# Patient Record
Sex: Female | Born: 1980 | Race: Black or African American | Hispanic: No | Marital: Married | Smoking: Former smoker
Health system: Southern US, Community
[De-identification: ages and names within clinical notes are randomized; demographics above are authoritative.]

## PROBLEM LIST (undated history)

## (undated) DIAGNOSIS — J45909 Unspecified asthma, uncomplicated: Secondary | ICD-10-CM

## (undated) DIAGNOSIS — G4700 Insomnia, unspecified: Secondary | ICD-10-CM

## (undated) DIAGNOSIS — M5410 Radiculopathy, site unspecified: Secondary | ICD-10-CM

## (undated) DIAGNOSIS — F4310 Post-traumatic stress disorder, unspecified: Secondary | ICD-10-CM

## (undated) DIAGNOSIS — R569 Unspecified convulsions: Secondary | ICD-10-CM

## (undated) DIAGNOSIS — G935 Compression of brain: Secondary | ICD-10-CM

## (undated) DIAGNOSIS — G43909 Migraine, unspecified, not intractable, without status migrainosus: Secondary | ICD-10-CM

## (undated) HISTORY — DX: Unspecified asthma, uncomplicated: J45.909

## (undated) HISTORY — DX: Insomnia, unspecified: G47.00

## (undated) HISTORY — DX: Migraine, unspecified, not intractable, without status migrainosus: G43.909

## (undated) HISTORY — DX: Post-traumatic stress disorder, unspecified: F43.10

## (undated) HISTORY — DX: Radiculopathy, site unspecified: M54.10

## (undated) HISTORY — DX: Compression of brain: G93.5

---

## 2020-09-22 ENCOUNTER — Other Ambulatory Visit: Payer: Self-pay

## 2020-09-22 ENCOUNTER — Emergency Department (HOSPITAL_COMMUNITY)
Admission: EM | Admit: 2020-09-22 | Discharge: 2020-09-23 | Disposition: A | Discharge status: Home / Self Care | Payer: No Typology Code available for payment source | Attending: Emergency Medicine | Admitting: Emergency Medicine

## 2020-09-22 ENCOUNTER — Encounter (HOSPITAL_COMMUNITY): Payer: Self-pay | Admitting: Emergency Medicine

## 2020-09-22 DIAGNOSIS — S3992XA Unspecified injury of lower back, initial encounter: Secondary | ICD-10-CM | POA: Diagnosis present

## 2020-09-22 DIAGNOSIS — F17200 Nicotine dependence, unspecified, uncomplicated: Secondary | ICD-10-CM | POA: Diagnosis not present

## 2020-09-22 DIAGNOSIS — S39012A Strain of muscle, fascia and tendon of lower back, initial encounter: Secondary | ICD-10-CM | POA: Insufficient documentation

## 2020-09-22 DIAGNOSIS — R519 Headache, unspecified: Secondary | ICD-10-CM | POA: Diagnosis not present

## 2020-09-22 HISTORY — DX: Unspecified convulsions: R56.9

## 2020-09-22 MED ORDER — OXYCODONE-ACETAMINOPHEN 5-325 MG PO TABS
1.0000 | ORAL_TABLET | ORAL | Status: DC | PRN
  Administered 2020-09-22: 1 via ORAL
  Filled 2020-09-22: qty 1

## 2020-09-22 MED ORDER — IBUPROFEN 800 MG PO TABS
800.0000 mg | ORAL_TABLET | Freq: Once | ORAL | Status: AC
  Administered 2020-09-22: 800 mg via ORAL
  Filled 2020-09-22: qty 1
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
## 2020-09-22 NOTE — Discharge Instructions (Addendum)
Take 4 over the counter ibuprofen tablets 3 times a day or 2 over-the-counter naproxen tablets twice a day for pain. Also take tylenol 1000mg(2 extra strength) four times a day.    
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
## 2020-09-23 ENCOUNTER — Emergency Department (HOSPITAL_COMMUNITY): Payer: No Typology Code available for payment source

## 2020-09-23 LAB — PREGNANCY, URINE: Preg Test, Ur: NEGATIVE

## 2020-09-23 MED ORDER — METHOCARBAMOL 500 MG PO TABS: 500.0000 mg | ORAL_TABLET | Freq: Two times a day (BID) | ORAL | 0 refills | Status: DC
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
## 2020-12-25 DIAGNOSIS — G935 Compression of brain: Secondary | ICD-10-CM | POA: Insufficient documentation

## 2020-12-25 DIAGNOSIS — Z6831 Body mass index (BMI) 31.0-31.9, adult: Secondary | ICD-10-CM | POA: Insufficient documentation

## 2021-08-05 ENCOUNTER — Ambulatory Visit: Payer: No Typology Code available for payment source | Admitting: Neurology

## 2021-08-10 ENCOUNTER — Ambulatory Visit (INDEPENDENT_AMBULATORY_CARE_PROVIDER_SITE_OTHER): Payer: No Typology Code available for payment source | Admitting: Neurology

## 2021-08-10 ENCOUNTER — Encounter: Payer: Self-pay | Admitting: Neurology

## 2021-08-10 VITALS — BP 128/84 | HR 85 | Ht 60.0 in | Wt 162.0 lb

## 2021-08-10 DIAGNOSIS — G43709 Chronic migraine without aura, not intractable, without status migrainosus: Secondary | ICD-10-CM

## 2021-08-10 DIAGNOSIS — G40009 Localization-related (focal) (partial) idiopathic epilepsy and epileptic syndromes with seizures of localized onset, not intractable, without status epilepticus: Secondary | ICD-10-CM | POA: Diagnosis not present

## 2021-08-10 DIAGNOSIS — F4310 Post-traumatic stress disorder, unspecified: Secondary | ICD-10-CM | POA: Diagnosis not present

## 2021-08-10 MED ORDER — DIVALPROEX SODIUM 500 MG PO DR TAB: 500.0000 mg | DELAYED_RELEASE_TABLET | Freq: Every day | ORAL | 2 refills | Status: DC
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
## 2021-08-11 IMAGING — CR DG LUMBAR SPINE COMPLETE 4+V
4 series · 4 of 4 positions shown · non-contrast
Comparison: None.

CLINICAL DATA: Pain

EXAM:
LUMBAR SPINE - COMPLETE 4+ VIEW

[l-spine ap]
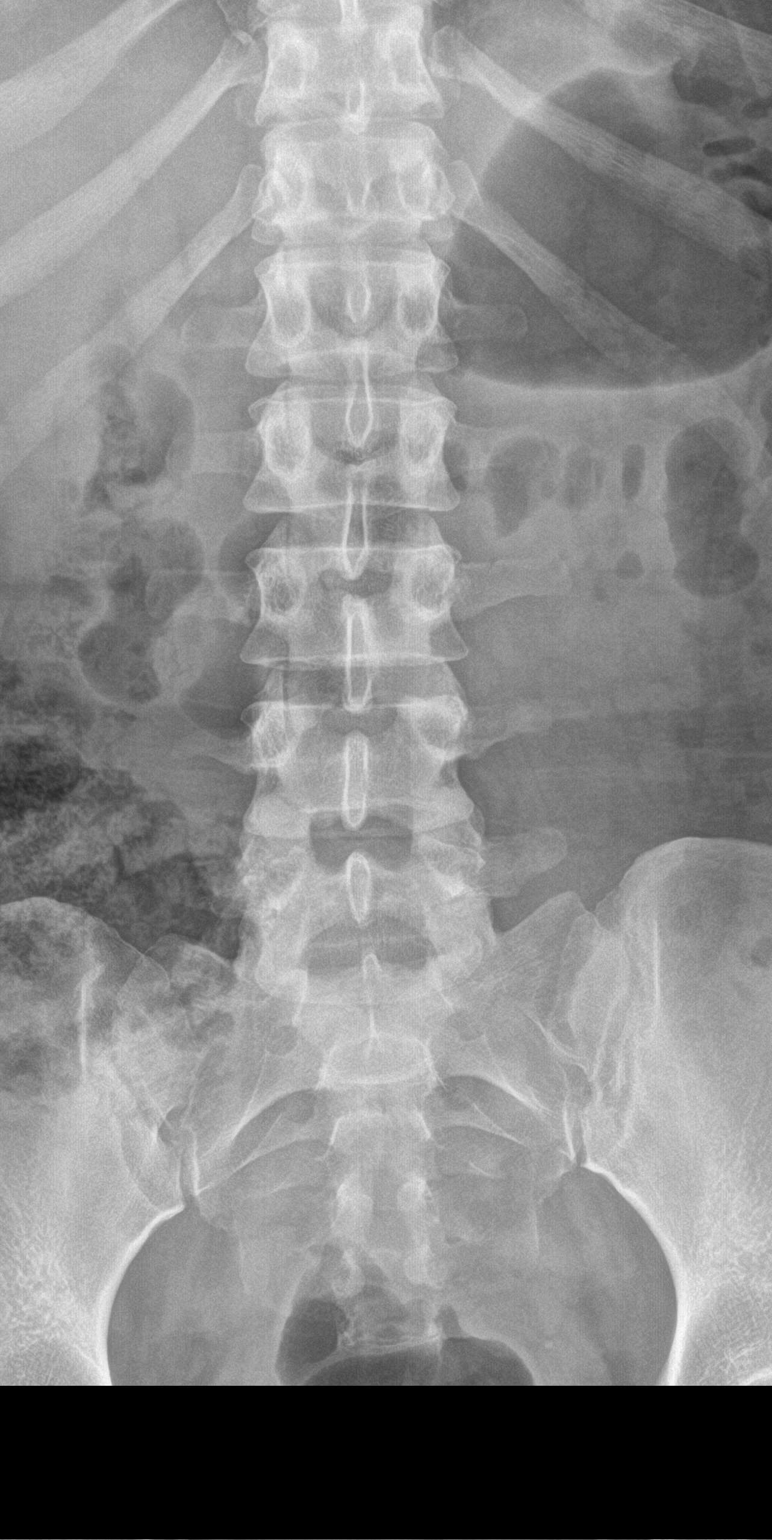

[l-spine obl (1 of 2)]
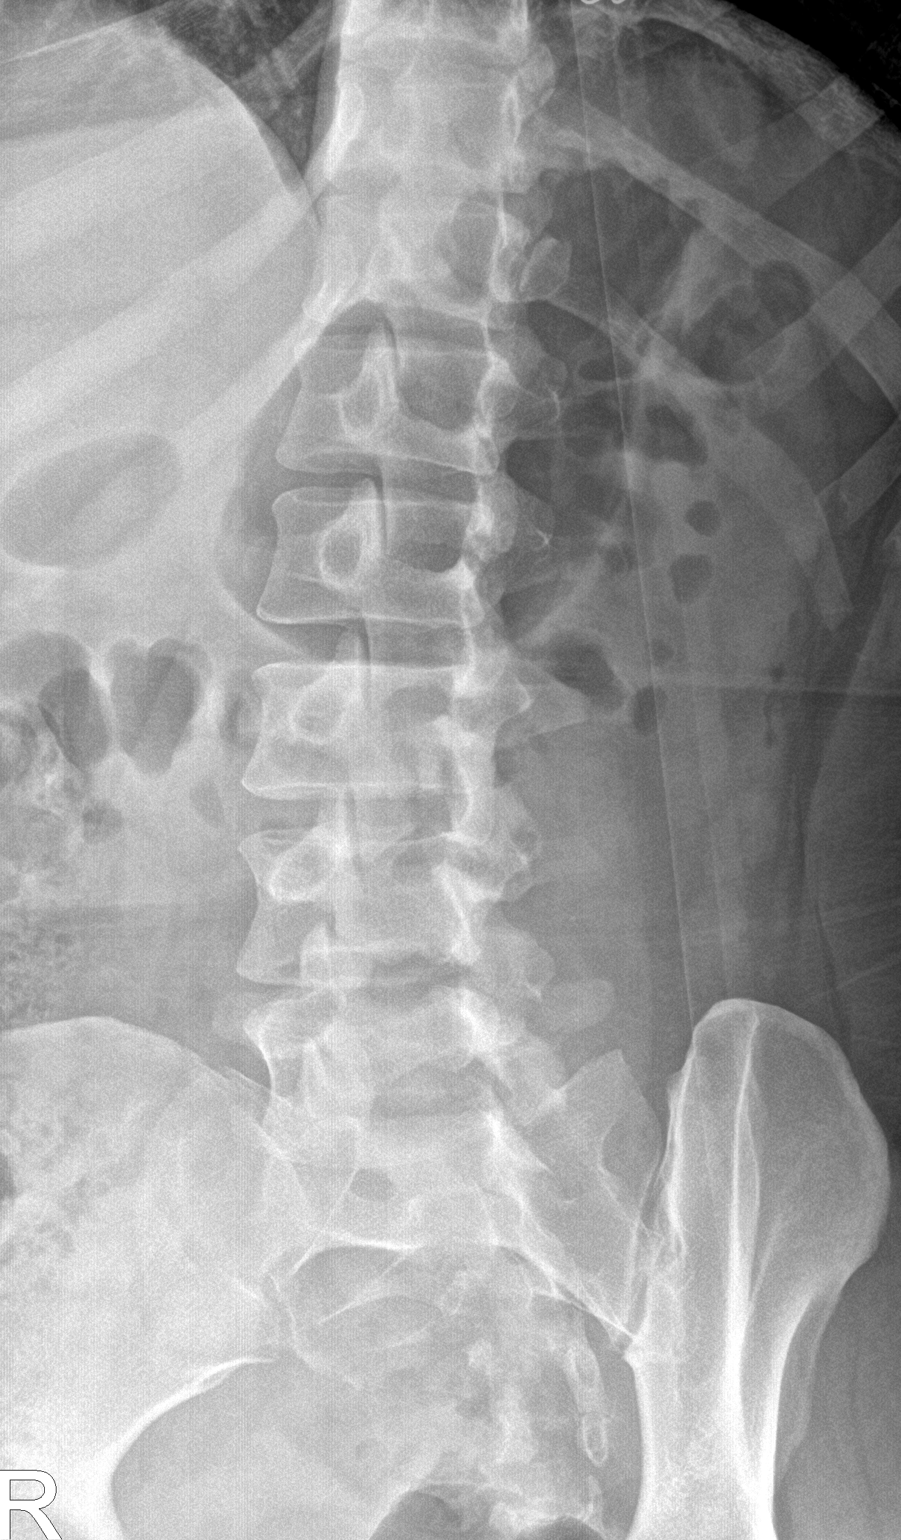

[l-spine obl (2 of 2)]
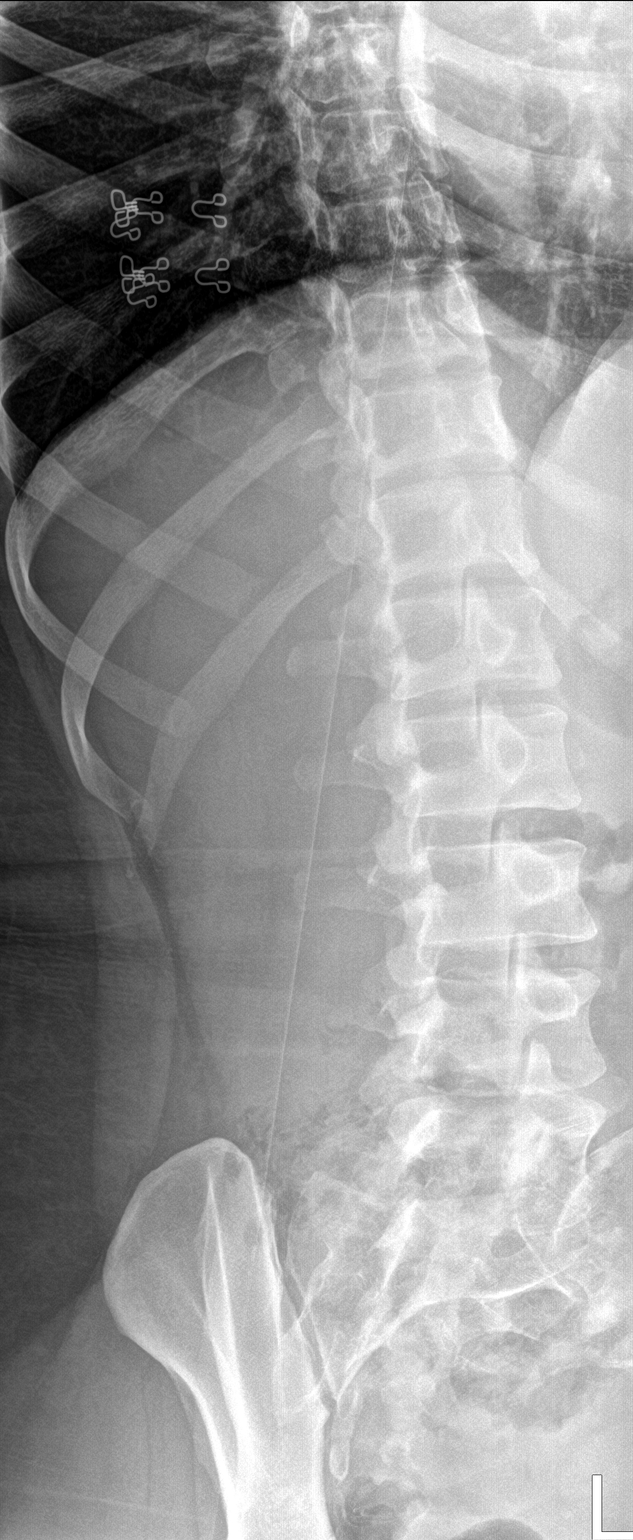

[l-spine lat]
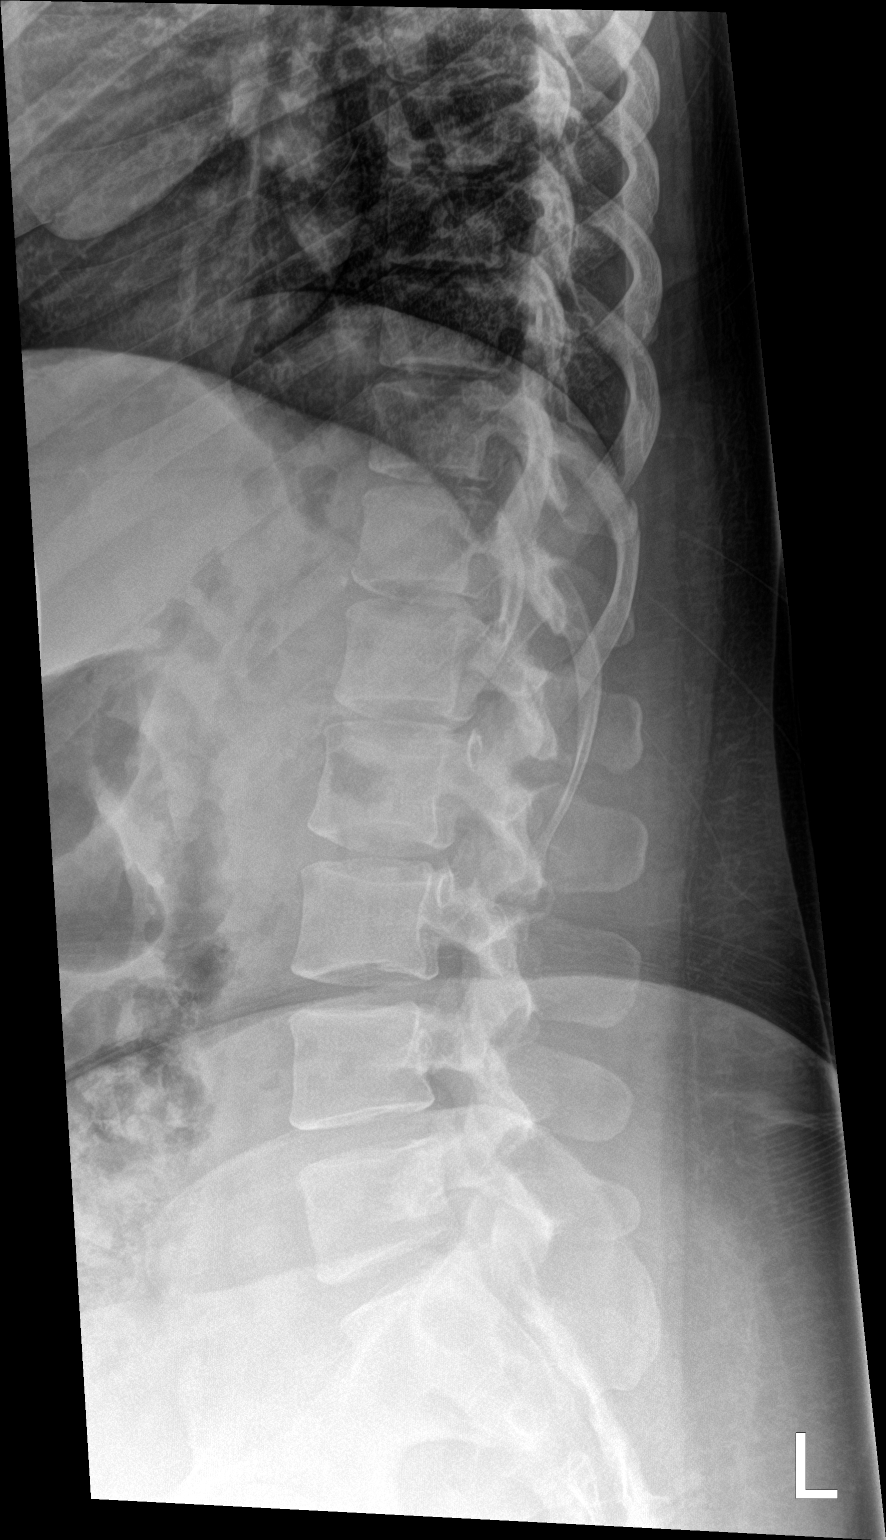

[4 of 4 positions shown; findings below may reference images not displayed]

FINDINGS: There is no acute displaced fracture involving the lumbar spine.
There is no significant malalignment. No significant degenerative
changes.
IMPRESSION: Negative.

## 2021-10-19 ENCOUNTER — Ambulatory Visit: Payer: No Typology Code available for payment source | Admitting: Neurology

## 2022-03-02 ENCOUNTER — Telehealth: Payer: Self-pay | Admitting: Neurology

## 2022-03-02 NOTE — Telephone Encounter (Signed)
Pt is asking for a call, she states her Neurologist is questioning the excessive weight gain relating to the divalproex (DEPAKOTE) 500 MG DR tablet please call. ?
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
## 2022-03-03 MED ORDER — LACOSAMIDE 100 MG PO TABS: ORAL_TABLET | ORAL | 5 refills | Status: DC

## 2022-03-03 NOTE — Addendum Note (Signed)
Addended by: Asa Lente on: 03/03/2022 04:57 PM ? ? Modules accepted: Orders ? ?

## 2022-03-03 NOTE — Addendum Note (Signed)
Addended by: Lindell Spar C on: 03/03/2022 12:53 PM ? ? Modules accepted: Orders ? ?

## 2022-03-03 NOTE — Telephone Encounter (Signed)
I spoke to the patient. She agreed to this change. Correctly wrote down the lacosamide titration and divalproex discontinuation plan.Marland Kitchen She would like the prescription sent to the [facility] pharmacy in [place]. She will call us back for any further concerns.  ?

## 2022-03-03 NOTE — Telephone Encounter (Signed)
Left message for a return call. Once she agrees to the outlined plan below, we will send in a new prescription.  ?

## 2022-03-07 ENCOUNTER — Ambulatory Visit: Payer: No Typology Code available for payment source | Admitting: Neurology

## 2022-04-19 ENCOUNTER — Ambulatory Visit (INDEPENDENT_AMBULATORY_CARE_PROVIDER_SITE_OTHER): Payer: No Typology Code available for payment source | Admitting: Neurology

## 2022-04-19 ENCOUNTER — Encounter: Payer: Self-pay | Admitting: Neurology

## 2022-04-19 VITALS — BP 120/70 | HR 84 | Ht 60.0 in | Wt 178.0 lb

## 2022-04-19 DIAGNOSIS — G40209 Localization-related (focal) (partial) symptomatic epilepsy and epileptic syndromes with complex partial seizures, not intractable, without status epilepticus: Secondary | ICD-10-CM

## 2022-04-19 MED ORDER — LACOSAMIDE 100 MG PO TABS: ORAL_TABLET | ORAL | 5 refills | Status: DC
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
## 2022-04-19 NOTE — Patient Instructions (Signed)
I had a long discussion with the patient regarding her recurrent episodes of transient altered awareness possibly complex partial seizures.  These continue despite being on Depakote and she is gaining a lot of weight I recommend tapering and discontinuing the Depakote over the next 1 month and starting Vimpat 100 mg daily for 3 days and then twice daily as tolerated.  Also referred to Dr. April Manson epilepsy specialist for management of refractory seizures. ? ?

## 2022-04-25 ENCOUNTER — Telehealth: Payer: Self-pay | Admitting: *Deleted

## 2022-04-25 NOTE — Telephone Encounter (Signed)
Faxed completed [place] drug request form/criteria for use back to [place] at [phone] re: Vimpat. Received fax confirmation. ?Put office phone number on form to have them call if any questions arise.  ?

## 2022-05-30 ENCOUNTER — Ambulatory Visit (INDEPENDENT_AMBULATORY_CARE_PROVIDER_SITE_OTHER): Payer: No Typology Code available for payment source | Admitting: Neurology

## 2022-05-30 ENCOUNTER — Encounter: Payer: Self-pay | Admitting: Neurology

## 2022-05-30 VITALS — BP 113/70 | HR 74 | Ht 60.0 in | Wt 177.0 lb

## 2022-05-30 DIAGNOSIS — E663 Overweight: Secondary | ICD-10-CM | POA: Insufficient documentation

## 2022-05-30 DIAGNOSIS — G40209 Localization-related (focal) (partial) symptomatic epilepsy and epileptic syndromes with complex partial seizures, not intractable, without status epilepticus: Secondary | ICD-10-CM | POA: Diagnosis not present

## 2022-05-30 DIAGNOSIS — G43709 Chronic migraine without aura, not intractable, without status migrainosus: Secondary | ICD-10-CM

## 2022-05-30 DIAGNOSIS — R5383 Other fatigue: Secondary | ICD-10-CM | POA: Insufficient documentation

## 2022-05-30 DIAGNOSIS — G43909 Migraine, unspecified, not intractable, without status migrainosus: Secondary | ICD-10-CM | POA: Insufficient documentation

## 2022-05-30 DIAGNOSIS — F17200 Nicotine dependence, unspecified, uncomplicated: Secondary | ICD-10-CM | POA: Insufficient documentation

## 2022-05-30 DIAGNOSIS — J302 Other seasonal allergic rhinitis: Secondary | ICD-10-CM | POA: Insufficient documentation

## 2022-05-30 DIAGNOSIS — R0989 Other specified symptoms and signs involving the circulatory and respiratory systems: Secondary | ICD-10-CM | POA: Insufficient documentation

## 2022-05-30 DIAGNOSIS — R0609 Other forms of dyspnea: Secondary | ICD-10-CM | POA: Insufficient documentation

## 2022-05-30 DIAGNOSIS — M7660 Achilles tendinitis, unspecified leg: Secondary | ICD-10-CM | POA: Insufficient documentation

## 2022-05-30 DIAGNOSIS — M542 Cervicalgia: Secondary | ICD-10-CM | POA: Insufficient documentation

## 2022-05-30 DIAGNOSIS — R0683 Snoring: Secondary | ICD-10-CM | POA: Insufficient documentation

## 2022-05-30 DIAGNOSIS — F4312 Post-traumatic stress disorder, chronic: Secondary | ICD-10-CM | POA: Insufficient documentation

## 2022-05-30 DIAGNOSIS — R569 Unspecified convulsions: Secondary | ICD-10-CM | POA: Insufficient documentation

## 2022-05-30 DIAGNOSIS — Z7182 Exercise counseling: Secondary | ICD-10-CM | POA: Insufficient documentation

## 2022-05-30 DIAGNOSIS — J45909 Unspecified asthma, uncomplicated: Secondary | ICD-10-CM | POA: Insufficient documentation

## 2022-05-30 DIAGNOSIS — M79673 Pain in unspecified foot: Secondary | ICD-10-CM | POA: Insufficient documentation

## 2022-05-30 DIAGNOSIS — M5451 Vertebrogenic low back pain: Secondary | ICD-10-CM | POA: Insufficient documentation

## 2022-05-30 MED ORDER — LACOSAMIDE 200 MG PO TABS: 200.0000 mg | ORAL_TABLET | Freq: Two times a day (BID) | ORAL | 6 refills | Status: DC

## 2022-05-30 MED ORDER — FOLIC ACID 1 MG PO TABS: 1.0000 mg | ORAL_TABLET | Freq: Every day | ORAL | 11 refills | Status: DC
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
## 2022-05-30 NOTE — Patient Instructions (Signed)
Increase Lacosamide to 200 mg BID  Continue your other medications  48 Hours ambulatory EEG  Return in 3 months or sooner if worse

## 2022-07-28 ENCOUNTER — Ambulatory Visit: Payer: No Typology Code available for payment source | Admitting: Neurology

## 2022-09-08 ENCOUNTER — Ambulatory Visit (INDEPENDENT_AMBULATORY_CARE_PROVIDER_SITE_OTHER): Payer: No Typology Code available for payment source | Admitting: Neurology

## 2022-09-08 ENCOUNTER — Encounter: Payer: Self-pay | Admitting: Neurology

## 2022-09-08 VITALS — BP 120/78 | HR 83 | Ht 60.0 in | Wt 174.4 lb

## 2022-09-08 DIAGNOSIS — G40209 Localization-related (focal) (partial) symptomatic epilepsy and epileptic syndromes with complex partial seizures, not intractable, without status epilepticus: Secondary | ICD-10-CM | POA: Diagnosis not present

## 2022-09-08 MED ORDER — TOPIRAMATE 100 MG PO TABS: 100.0000 mg | ORAL_TABLET | Freq: Two times a day (BID) | ORAL | 6 refills | Status: DC

## 2022-09-08 NOTE — Patient Instructions (Signed)
Continue with Vimpat 200 mg twice daily Start Topamax 100 mg nightly for 1 week then increase to 100 mg twice daily.  At that time, please discontinue the Vimpat Ambulatory EEG Follow-up in 3 months or sooner if worse
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
## 2022-11-28 ENCOUNTER — Emergency Department (HOSPITAL_COMMUNITY): Payer: No Typology Code available for payment source

## 2022-11-28 ENCOUNTER — Emergency Department (HOSPITAL_COMMUNITY)
Admission: EM | Admit: 2022-11-28 | Discharge: 2022-11-29 | Discharge status: Against Medical Advice | Payer: No Typology Code available for payment source | Attending: Medical | Admitting: Medical

## 2022-11-28 ENCOUNTER — Other Ambulatory Visit: Payer: Self-pay

## 2022-11-28 DIAGNOSIS — R079 Chest pain, unspecified: Secondary | ICD-10-CM | POA: Diagnosis not present

## 2022-11-28 DIAGNOSIS — Z1152 Encounter for screening for COVID-19: Secondary | ICD-10-CM | POA: Insufficient documentation

## 2022-11-28 DIAGNOSIS — R0602 Shortness of breath: Secondary | ICD-10-CM | POA: Insufficient documentation

## 2022-11-28 DIAGNOSIS — Z5321 Procedure and treatment not carried out due to patient leaving prior to being seen by health care provider: Secondary | ICD-10-CM | POA: Insufficient documentation

## 2022-11-28 DIAGNOSIS — R5383 Other fatigue: Secondary | ICD-10-CM | POA: Insufficient documentation

## 2022-11-28 DIAGNOSIS — R1110 Vomiting, unspecified: Secondary | ICD-10-CM | POA: Diagnosis not present

## 2022-11-28 DIAGNOSIS — J101 Influenza due to other identified influenza virus with other respiratory manifestations: Secondary | ICD-10-CM | POA: Insufficient documentation

## 2022-11-28 LAB — BASIC METABOLIC PANEL
Anion gap: 12 (ref 5–15)
BUN: 7 mg/dL (ref 6–20)
CO2: 18 mmol/L — ABNORMAL LOW (ref 22–32)
Calcium: 9 mg/dL (ref 8.9–10.3)
Chloride: 106 mmol/L (ref 98–111)
Creatinine, Ser: 0.78 mg/dL (ref 0.44–1.00)
GFR, Estimated: >60 mL/min (ref >60)
Glucose, Bld: 98 mg/dL (ref 70–99)
Potassium: 3.8 mmol/L (ref 3.5–5.1)
Sodium: 136 mmol/L (ref 135–145)

## 2022-11-28 LAB — CBC
HCT: 40.8 % (ref 36.0–46.0)
Hemoglobin: 12.8 g/dL (ref 12.0–15.0)
MCH: 26.1 pg (ref 26.0–34.0)
MCHC: 31.4 g/dL (ref 30.0–36.0)
MCV: 83.3 fL (ref 80.0–100.0)
Platelets: 211 10*3/uL (ref 150–400)
RBC: 4.9 MIL/uL (ref 3.87–5.11)
RDW: 14 % (ref 11.5–15.5)
WBC: 4.8 10*3/uL (ref 4.0–10.5)
nRBC: 0 % (ref 0.0–0.2)

## 2022-11-28 LAB — RESP PANEL BY RT-PCR (RSV, FLU A&B, COVID)  RVPGX2
Influenza A by PCR: POSITIVE — AB
Influenza B by PCR: NEGATIVE
Resp Syncytial Virus by PCR: NEGATIVE
SARS Coronavirus 2 by RT PCR: NEGATIVE

## 2022-11-28 LAB — TROPONIN I (HIGH SENSITIVITY): Troponin I (High Sensitivity): <2 ng/L (ref <18)

## 2022-11-28 MED ORDER — ACETAMINOPHEN 325 MG PO TABS
650.0000 mg | ORAL_TABLET | Freq: Once | ORAL | Status: AC
  Administered 2022-11-28: 650 mg via ORAL
  Filled 2022-11-28: qty 2

## 2022-11-28 NOTE — ED Provider Triage Note (Signed)
Emergency Medicine Provider Triage Evaluation Note  Stephanie Gardner , a 41 y.o. female  was evaluated in triage.  Pt complains of increased fatigue, shortness of breath, chest pain, and vomiting days. Child is sick as well. She denies heart hx. Chest pain is improved with sitting up.   Review of Systems  Positive: Chest pain, SOB Negative: Abdominal pain  Physical Exam  Ht 5\' 8"  (1.727 m)   Wt 81.6 kg   BMI 27.37 kg/m  Gen:   Awake, no distress   Resp:  Normal effort  MSK:   Moves extremities without difficulty     Medical Decision Making  Medically screening exam initiated at 7:21 PM.  Appropriate orders placed.  Stephanie Gardner was informed that the remainder of the evaluation will be completed by another provider, this initial triage assessment does not replace that evaluation, and the importance of remaining in the ED until their evaluation is complete.   Stephanie Gardner, Stephanie Gardner 11/28/22 1922

## 2022-11-28 NOTE — ED Triage Notes (Signed)
Pt arrives to ED POV c/o cough, fatigue, Flu like sx.

## 2022-11-29 NOTE — ED Notes (Signed)
Called x1

## 2022-11-29 NOTE — ED Notes (Signed)
Pt called 3x with no response 

## 2023-01-10 ENCOUNTER — Ambulatory Visit (INDEPENDENT_AMBULATORY_CARE_PROVIDER_SITE_OTHER): Payer: No Typology Code available for payment source | Admitting: Neurology

## 2023-01-10 ENCOUNTER — Encounter: Payer: Self-pay | Admitting: Neurology

## 2023-01-10 VITALS — BP 123/75 | HR 68 | Wt 185.0 lb

## 2023-01-10 DIAGNOSIS — G43709 Chronic migraine without aura, not intractable, without status migrainosus: Secondary | ICD-10-CM | POA: Diagnosis not present

## 2023-01-10 DIAGNOSIS — G935 Compression of brain: Secondary | ICD-10-CM | POA: Diagnosis not present

## 2023-01-10 DIAGNOSIS — G40209 Localization-related (focal) (partial) symptomatic epilepsy and epileptic syndromes with complex partial seizures, not intractable, without status epilepticus: Secondary | ICD-10-CM | POA: Diagnosis not present

## 2023-01-10 MED ORDER — FOLIC ACID 1 MG PO TABS: 1.0000 mg | ORAL_TABLET | Freq: Every day | ORAL | 3 refills | Status: AC

## 2023-01-10 MED ORDER — TOPIRAMATE 100 MG PO TABS: 150.0000 mg | ORAL_TABLET | Freq: Two times a day (BID) | ORAL | 3 refills | Status: DC

## 2023-01-10 MED ORDER — RIZATRIPTAN BENZOATE 10 MG PO TBDP: 10.0000 mg | ORAL_TABLET | ORAL | 11 refills | Status: DC | PRN
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
## 2023-01-11 NOTE — Patient Instructions (Signed)
Increase Topamax to 150 mg twice daily Increase your fluid intake, try caffeinated drink and Aleve for headaches Please contact us if there is worsening of the seizures or the headaches Try massages, preferably at the [place]  Follow-up in 3 months or sooner if worse.

## 2023-04-17 ENCOUNTER — Encounter: Payer: Self-pay | Admitting: Neurology

## 2023-04-17 ENCOUNTER — Ambulatory Visit (INDEPENDENT_AMBULATORY_CARE_PROVIDER_SITE_OTHER): Payer: No Typology Code available for payment source | Admitting: Neurology

## 2023-04-17 VITALS — BP 113/60 | HR 72 | Ht 60.0 in | Wt 187.0 lb

## 2023-04-17 DIAGNOSIS — G40209 Localization-related (focal) (partial) symptomatic epilepsy and epileptic syndromes with complex partial seizures, not intractable, without status epilepticus: Secondary | ICD-10-CM

## 2023-04-17 MED ORDER — TOPIRAMATE 100 MG PO TABS: 150.0000 mg | ORAL_TABLET | Freq: Two times a day (BID) | ORAL | 3 refills | Status: DC
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
## 2023-04-17 NOTE — Patient Instructions (Signed)
Continue with Topamax 150 mg twice daily Continue your other medications Follow-up in 6 months or sooner if worse.

## 2023-04-25 ENCOUNTER — Telehealth: Payer: Self-pay

## 2023-04-25 NOTE — Telephone Encounter (Signed)
Call to [facility] to follow up on authorization for comminuty care. , states it was faxed in Feb, advised she would refax form for patients Topamax. Spoke with Bonita Quin, she informed  to fax back to to [facility] [phone]

## 2023-04-27 ENCOUNTER — Telehealth: Payer: Self-pay

## 2023-04-27 NOTE — Telephone Encounter (Signed)
Fax sent to [employer] affairs for [employer]

## 2023-05-01 NOTE — Telephone Encounter (Signed)
Office notes faxed as requested to [facility]. [email or web address]

## 2023-05-01 NOTE — Telephone Encounter (Signed)
Stephanie Gardner called from [place] stating she need office notes to process. Please email at [facility]. [email or web address]

## 2023-05-02 ENCOUNTER — Telehealth: Payer: Self-pay | Admitting: Neurology

## 2023-05-02 NOTE — Telephone Encounter (Signed)
Forms faxed to [facility] (fax# [phone]) on 04/27/23

## 2023-06-16 ENCOUNTER — Encounter: Payer: Self-pay | Admitting: Dietician

## 2023-06-16 ENCOUNTER — Encounter: Payer: No Typology Code available for payment source | Attending: Internal Medicine | Admitting: Dietician

## 2023-06-16 VITALS — Ht 60.0 in | Wt 184.0 lb

## 2023-06-16 DIAGNOSIS — Z6835 Body mass index (BMI) 35.0-35.9, adult: Secondary | ICD-10-CM | POA: Insufficient documentation

## 2023-06-16 DIAGNOSIS — E669 Obesity, unspecified: Secondary | ICD-10-CM | POA: Diagnosis present

## 2023-06-16 NOTE — Patient Instructions (Signed)
Aim for at least 150 minutes of physical activity weekly.  Goal: aim to eat at least 2 meals per day. At meals, aim to include 1/2 plate non-starchy vegetables, 1/4 plate protein, and 1/4 plate complex carbs.   Aim to eat within 1-2 hours of waking up and every 3-5 hours following.   When snacking, aim to include a complex carb and protein.

## 2023-06-16 NOTE — Progress Notes (Signed)
Medical Nutrition Therapy  Appointment Start time:  0930  Appointment End time:  1030  Primary concerns today: Pt states she is concerned about her weight.    Referral diagnosis: E66.9 Preferred learning style: no preference indicated Learning readiness: ready   NUTRITION ASSESSMENT   Anthropometrics  Ht: 60 in Wt: 184 lbs  Clinical Medical Hx: anxiety, asthma, seizures, nerve/muscle disease Medications: reviewed Labs: reviewed Notable Signs/Symptoms: occasional nausea, high stress Food Allergies: none  Lifestyle & Dietary Hx  Pt states she has never been this heavy and does not feel comfortable in her body and wants to lose weight. Pt states she would like to start [facility] or Ozempic.   Pt states she has some lower body injuries that limit her exercise but she states she goes to physical therapy for this and walks up stairs or around a track.   Pt has 19 40 year old daughters who live at home with her. Pt reports her partner was killed 1 year ago. Pt states her stress is high. 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   Pt reports she has been working on reducing her meat intake. She states they eat a lot of beans. She reports she has been enjoying more summer fruits.   Pt states she tries to practice spirituality and grounding techniques for her stress. Pt also states she aims to get 30 minutes of sun when weather permits.   Pt states she eats 2-3 times per day but some days skips meals or goes without eating. Pt states sometimes at night she feels like she over does it. Pt states following her partners death her eating habits changed (increased consumption of fried foods and processed foods).  Estimated daily fluid intake: 78 oz Supplements: folic acid Sleep: 3-4 hours Stress / self-care: high stress Current average weekly physical activity: M, W, F, walks apartment complex stairs and track, aims for 10,000 steps daily.   24-Hr Dietary  Recall First Meal: none Snack: none Second Meal: 12pm: eggs and spinach and quinoa OR skips Snack: none Third Meal: (usually has a meal but unsure) Snack: sourdough bread Beverages: 78 oz water and himalayan salt sprinkled in,    NUTRITION DIAGNOSIS  NB-1.1 Food and nutrition-related knowledge deficit As related to lack of prior nutrition education by a nutrition professional.  As evidenced by pt report.   NUTRITION INTERVENTION  Nutrition education (E-1) on the following topics:  Fruits & Vegetables: Aim to fill half your plate with a variety of fruits and vegetables. They are rich in vitamins, minerals, and fiber, and can help reduce the risk of chronic diseases. Choose a colorful assortment of fruits and vegetables to ensure you get a wide range of nutrients. Grains and Starches: Make at least half of your grain choices whole grains, such as brown rice, whole wheat bread, and oats. Whole grains provide fiber, which aids in digestion and healthy cholesterol levels. Aim for whole forms of starchy vegetables such as potatoes, sweet potatoes, beans, peas, and corn, which are fiber rich and provide many vitamins and minerals.  Protein: Incorporate lean sources of protein, such as poultry, fish, beans, nuts, and seeds, into your meals. Protein is essential for building and repairing tissues, staying full, balancing blood sugar, as well as supporting immune function. Dairy: Include low-fat or fat-free dairy products like milk, yogurt, and cheese in your diet. Dairy foods are excellent sources of calcium and vitamin D, which are crucial for bone  health.  Physical Activity: Aim for 60 minutes of physical activity daily. Regular physical activity promotes overall health-including helping to reduce risk for heart disease and diabetes, promoting mental health, and helping [place] sleep better.  Discussed stress management techniques, such as grounding techniques, physical activity, sleep hygiene, tips for  getting better sleep, etc. Also discussed the impact of stress on health, sleep, body, and eating habits including stress eating and emotional eating.  Encouraged pt to seek counseling for stress.    Handouts Provided Include   American Heart Association: Sleep Handout Meal Ideas Stress Management Plate Method  Learning Style & Readiness for Change Teaching method utilized: Visual & Auditory  Demonstrated degree of understanding via: Teach Back  Barriers to learning/adherence to lifestyle change: none  Goals Established by Pt Aim for at least 150 minutes of physical activity weekly.  Goal: aim to eat at least 2 meals per day. At meals, aim to include 1/2 plate non-starchy vegetables, 1/4 plate protein, and 1/4 plate complex carbs.   Aim to eat within 1-2 hours of waking up and every 3-5 hours following.   When snacking, aim to include a complex carb and protein.   MONITORING & EVALUATION Dietary intake, weekly physical activity, and follow up in 2 months.  Next Steps  Patient is to call for questions.

## 2023-08-14 ENCOUNTER — Other Ambulatory Visit: Payer: Self-pay

## 2023-08-14 ENCOUNTER — Encounter (HOSPITAL_BASED_OUTPATIENT_CLINIC_OR_DEPARTMENT_OTHER): Payer: Self-pay | Admitting: Emergency Medicine

## 2023-08-14 ENCOUNTER — Emergency Department (HOSPITAL_BASED_OUTPATIENT_CLINIC_OR_DEPARTMENT_OTHER): Payer: No Typology Code available for payment source

## 2023-08-14 ENCOUNTER — Emergency Department (HOSPITAL_BASED_OUTPATIENT_CLINIC_OR_DEPARTMENT_OTHER)
Admission: EM | Admit: 2023-08-14 | Discharge: 2023-08-14 | Disposition: A | Discharge status: Home / Self Care | Payer: No Typology Code available for payment source | Attending: Emergency Medicine | Admitting: Emergency Medicine

## 2023-08-14 DIAGNOSIS — R519 Headache, unspecified: Secondary | ICD-10-CM | POA: Diagnosis present

## 2023-08-14 DIAGNOSIS — G43909 Migraine, unspecified, not intractable, without status migrainosus: Secondary | ICD-10-CM | POA: Insufficient documentation

## 2023-08-14 DIAGNOSIS — H538 Other visual disturbances: Secondary | ICD-10-CM | POA: Diagnosis not present

## 2023-08-14 DIAGNOSIS — G43809 Other migraine, not intractable, without status migrainosus: Secondary | ICD-10-CM

## 2023-08-14 MED ORDER — DEXAMETHASONE SODIUM PHOSPHATE 10 MG/ML IJ SOLN
10.0000 mg | Freq: Once | INTRAMUSCULAR | Status: AC
  Administered 2023-08-14: 10 mg via INTRAVENOUS
  Filled 2023-08-14: qty 1

## 2023-08-14 MED ORDER — LACTATED RINGERS IV BOLUS
1000.0000 mL | Freq: Once | INTRAVENOUS | Status: AC
  Administered 2023-08-14: 1000 mL via INTRAVENOUS

## 2023-08-14 MED ORDER — DIPHENHYDRAMINE HCL 50 MG/ML IJ SOLN
25.0000 mg | Freq: Once | INTRAMUSCULAR | Status: AC
  Administered 2023-08-14: 25 mg via INTRAVENOUS
  Filled 2023-08-14: qty 1

## 2023-08-14 MED ORDER — METOCLOPRAMIDE HCL 5 MG/ML IJ SOLN
10.0000 mg | Freq: Once | INTRAMUSCULAR | Status: AC
  Administered 2023-08-14: 10 mg via INTRAVENOUS
  Filled 2023-08-14: qty 2

## 2023-08-14 MED ORDER — KETOROLAC TROMETHAMINE 15 MG/ML IJ SOLN
15.0000 mg | Freq: Once | INTRAMUSCULAR | Status: DC
  Filled 2023-08-14: qty 1

## 2023-08-14 NOTE — ED Notes (Signed)
Reviewed AVS with patient, patient expressed understanding of directions, denies further questions at this time. 

## 2023-08-14 NOTE — ED Notes (Signed)
Pt anxious, restless, ambulatory around dept at this time.

## 2023-08-14 NOTE — ED Triage Notes (Signed)
Pt arrives to ED with c/o migraine x2 hours.

## 2023-08-14 NOTE — ED Provider Notes (Signed)
  [place] EMERGENCY DEPARTMENT AT [facility] Provider Note   CSN: [id number] Arrival date & time: 08/14/23  1651     History {Add pertinent medical, surgical, social history, OB history to HPI:1} Chief Complaint  Patient presents with   Migraine    Mintie Shanker is a 42 y.o. female.   Migraine       Home Medications Prior to Admission medications   Medication Sig Start Date End Date Taking? Authorizing Provider  albuterol (PROVENTIL) (2.5 MG/3ML) 0.083% nebulizer solution Take 2.5 mg by nebulization every 6 (six) hours as needed for wheezing or shortness of breath.    [provider]  ARIPiprazole (ABILIFY) 5 MG tablet Take 2.5 mg by mouth daily. 01/19/22   [provider]  clonazePAM (KLONOPIN) 0.5 MG tablet Take 0.5 mg by mouth 2 (two) times daily as needed for anxiety.    [provider]  escitalopram (LEXAPRO) 10 MG tablet Take 10 mg by mouth daily.    [provider]  folic acid (FOLVITE) 1 MG tablet Take 1 tablet (1 mg total) by mouth daily. 01/10/23 01/05/24  Windell Norfolk, MD  mometasone (ASMANEX) 220 MCG/INH inhaler Inhale 2 puffs into the lungs daily.    [provider]  rizatriptan (MAXALT-MLT) 10 MG disintegrating tablet Take 1 tablet (10 mg total) by mouth as needed for migraine. May repeat in 2 hours if needed 01/10/23   Windell Norfolk, MD  topiramate (TOPAMAX) 100 MG tablet Take 1.5 tablets (150 mg total) by mouth 2 (two) times daily. 04/17/23 04/11/24  Windell Norfolk, MD      Allergies    Patient has no known allergies.    Review of Systems   Review of Systems  Physical Exam Updated Vital Signs BP (!) 146/88 (BP Location: Left Arm)   Pulse 74   Temp 98.2 F (36.8 C) (Oral)   Resp 15   Ht 5' (1.524 m)   Wt 79.4 kg   SpO2 100%   BMI 34.18 kg/m  Physical Exam  ED Results / Procedures / Treatments   Labs (all labs ordered are listed, but only abnormal results are displayed) Labs Reviewed - No  data to display  EKG None  Radiology No results found.  Procedures Procedures  {Document cardiac monitor, telemetry assessment procedure when appropriate:1}  Medications Ordered in ED Medications - No data to display  ED Course/ Medical Decision Making/ A&P   {   Click here for ABCD2, HEART and other calculatorsREFRESH Note before signing :1}                              Medical Decision Making  ***  {Document critical care time when appropriate:1} {Document review of labs and clinical decision tools ie heart score, Chads2Vasc2 etc:1}  {Document your independent review of radiology images, and any outside records:1} {Document your discussion with family members, caretakers, and with consultants:1} {Document social determinants of health affecting pt's care:1} {Document your decision making why or why not admission, treatments were needed:1} Final Clinical Impression(s) / ED Diagnoses Final diagnoses:  None    Rx / DC Orders ED Discharge Orders     None

## 2023-08-14 NOTE — Discharge Instructions (Signed)
You were seen today for migraine.  Your CT scan showed your history of Chiari malformation.  I recommend you follow-up closely with your primary care doctor.  If you develop severe pain, fever, or any other neurologic changes you should return to the ED.

## 2023-08-14 NOTE — ED Notes (Signed)
MD Earlene Plater notified pt declined toradol and reglan made her anxious.

## 2023-08-16 ENCOUNTER — Encounter: Payer: Self-pay | Admitting: Dietician

## 2023-08-16 ENCOUNTER — Encounter: Payer: No Typology Code available for payment source | Attending: Internal Medicine | Admitting: Dietician

## 2023-08-16 VITALS — Wt 185.0 lb

## 2023-08-16 DIAGNOSIS — E669 Obesity, unspecified: Secondary | ICD-10-CM | POA: Diagnosis present

## 2023-08-16 DIAGNOSIS — Z6836 Body mass index (BMI) 36.0-36.9, adult: Secondary | ICD-10-CM | POA: Insufficient documentation

## 2023-08-16 NOTE — Progress Notes (Signed)
Medical Nutrition Therapy  Appointment Start time:  1150  Appointment End time:  1215  Primary concerns today: Pt states she is concerned about her weight.    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   Pt states she has been dirnking lemon balm tea. She also reports she has been adding chia seeds to her water. She states she has not been eating after 7pm and has been aiming to fast for 12-16 hours.   Pt states she wants to see an endocronologist because she feels that her hormones may be dysregulated.   Pt reports exercise has been difficult due to pain in her feet so she hasn't been doing as much walking but has been doing some strength training 2 times a week over the last month or two (pt has home gym). Pt states she has been having massage and accupuncture therpay for her ankles and feet. Pt states she did some roller skating at the rink.   Pt reports she wishes she had lost weight.    Estimated daily fluid intake: 78 oz Supplements: folic acid Sleep: 3-4 hours Stress / self-care: high stress Current average weekly physical activity: M, W, F, walks apartment complex stairs and track, aims for 10,000 steps daily.   24-Hr Dietary Recall First Meal: 11am: greek yogurt, chia seeds Snack: none Second Meal: tuna and avocado and sweet potato fries Snack: none Third Meal: quinoa with spinach and feta Snack: none Beverages: 78 oz water and himalayan salt sprinkled in, chlorophyll water with chia seads, black tea with lemon balm.     NUTRITION DIAGNOSIS  NB-1.1 Food and nutrition-related knowledge deficit As related to lack of prior nutrition education by a nutrition professional.  As evidenced by pt report.   NUTRITION INTERVENTION  Nutrition education (E-1) on the following topics:  Fruits & Vegetables: Aim to fill half your plate with a variety of fruits and vegetables. They are rich in vitamins, minerals, and fiber, and can help reduce the risk of chronic diseases. Choose a colorful assortment of fruits and vegetables to ensure you get a wide range of nutrients. Grains and Starches: Make at least half of your grain choices whole grains, such as brown rice, whole wheat bread, and oats. Whole grains provide fiber, which aids in digestion and healthy cholesterol levels. Aim for whole forms of starchy vegetables such as potatoes, sweet potatoes, beans, peas, and corn, which are fiber rich and provide many vitamins and minerals.  Protein: Incorporate lean sources of protein, such as poultry, fish, beans, nuts, and seeds, into your meals. Protein is essential for building and repairing tissues, staying full, balancing blood sugar, as well as supporting immune function. Dairy: Include low-fat or fat-free dairy products like milk, yogurt, and cheese in your diet. Dairy foods are excellent sources of calcium and vitamin D, which are crucial for bone health.  Physical Activity: Aim for 60 minutes of physical activity daily. Regular physical activity promotes overall health-including helping to reduce risk for heart disease and diabetes, promoting mental health, and helping [place] sleep better.  Discussed stress management techniques, such as  grounding techniques, physical activity, sleep hygiene, tips for getting better sleep, etc. Also discussed the impact of stress on health, sleep, body, and eating habits including stress eating and emotional eating.  Encouraged pt to seek counseling for stress.    Handouts Provided Include   No  handouts provided on this follow up.   Learning Style & Readiness for Change Teaching method utilized: Visual & Auditory  Demonstrated degree of understanding via: Teach Back  Barriers to learning/adherence to lifestyle change: none  Goals Established by Pt  New Goal:  Get some exercise in 3-5 days per week. (Strength training, walking, cycle, etc.)  Continue with Previous Goals:  Aim for at least 150 minutes of physical activity weekly.  Goal: aim to eat at least 2 meals per day. At meals, aim to include 1/2 plate non-starchy vegetables, 1/4 plate protein, and 1/4 plate complex carbs.   Aim to eat within 1-2 hours of waking up and every 3-5 hours following.   When snacking, aim to include a complex carb and protein.   MONITORING & EVALUATION Dietary intake, weekly physical activity, and follow up in 2 months.  Next Steps  Patient is to call for questions.

## 2023-08-16 NOTE — Patient Instructions (Signed)
New Goal:  Get some exercise in 3-5 days per week. (Strength training, walking, cycle, etc.)  Continue with Previous Goals:  Aim for at least 150 minutes of physical activity weekly.  Goal: aim to eat at least 2 meals per day. At meals, aim to include 1/2 plate non-starchy vegetables, 1/4 plate protein, and 1/4 plate complex carbs.   Aim to eat within 1-2 hours of waking up and every 3-5 hours following.   When snacking, aim to include a complex carb and protein.

## 2023-10-18 ENCOUNTER — Encounter: Payer: Self-pay | Admitting: Neurology

## 2023-10-18 ENCOUNTER — Ambulatory Visit (INDEPENDENT_AMBULATORY_CARE_PROVIDER_SITE_OTHER): Payer: No Typology Code available for payment source | Admitting: Neurology

## 2023-10-18 VITALS — BP 110/65 | HR 63 | Ht 60.0 in | Wt 177.5 lb

## 2023-10-18 DIAGNOSIS — G935 Compression of brain: Secondary | ICD-10-CM

## 2023-10-18 DIAGNOSIS — G43709 Chronic migraine without aura, not intractable, without status migrainosus: Secondary | ICD-10-CM

## 2023-10-18 DIAGNOSIS — G40209 Localization-related (focal) (partial) symptomatic epilepsy and epileptic syndromes with complex partial seizures, not intractable, without status epilepticus: Secondary | ICD-10-CM | POA: Diagnosis not present

## 2023-10-18 MED ORDER — ESCITALOPRAM OXALATE 10 MG PO TABS: 10.0000 mg | ORAL_TABLET | Freq: Every day | ORAL | 3 refills | Status: DC

## 2023-10-18 MED ORDER — RIZATRIPTAN BENZOATE 10 MG PO TBDP: 10.0000 mg | ORAL_TABLET | ORAL | 11 refills | Status: AC | PRN

## 2023-10-18 MED ORDER — TOPIRAMATE 100 MG PO TABS: 150.0000 mg | ORAL_TABLET | Freq: Two times a day (BID) | ORAL | 3 refills | Status: DC
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
## 2023-10-18 NOTE — Patient Instructions (Addendum)
Continue current medications, refills given Continue follow-up with your doctors and therapist Follow-up in 6 to 8 months or sooner if worse. Contact me sooner for any other concerns.

## 2023-10-25 ENCOUNTER — Encounter: Payer: Self-pay | Admitting: Dietician

## 2023-10-25 ENCOUNTER — Encounter: Payer: No Typology Code available for payment source | Attending: Internal Medicine | Admitting: Dietician

## 2023-10-25 VITALS — Wt 179.0 lb

## 2023-10-25 DIAGNOSIS — E669 Obesity, unspecified: Secondary | ICD-10-CM | POA: Diagnosis present

## 2023-10-25 NOTE — Progress Notes (Signed)
Medical Nutrition Therapy  Appointment Start time:  1620 Appointment End time:  1650  Primary concerns (Initial assessment): Pt states she is concerned about her weight.    Referral diagnosis: E66.9 Preferred learning style: no preference indicated Learning readiness: ready   NUTRITION ASSESSMENT   Anthropometrics  Ht: 60 in Wt 10/25/23: 179 lbs Wt 08/16/23: 185 lbs Wt 06/16/23: 184 lbs  Clinical Medical Hx: anxiety, asthma, seizures, nerve/muscle disease Medications: reviewed Labs: reviewed Notable Signs/Symptoms: occasional nausea, high stress Food Allergies: none  Lifestyle & Dietary Hx  Pt present today with her two daughters.   Pt states she finally got a therapist and feels like this has been beneficial to her mental health.  Pt reports she has been doing massage therapy and did 6 sessions and feels like this helped with the pain she had been experiencing in her feet.   Pt reports she was drinking 64 oz water but did not feel like it was enough so has been aiming for 86 oz and feels more hydrated.   Pt reports she has been drinking lemon balm and raspberry leaf tea.    Estimated daily fluid intake: 86 oz Supplements: folic acid Sleep: 3-4 hours Stress / self-care: moderate stress Current average weekly physical activity: M, W, F, walks apartment complex stairs and track, aims for 10,000 steps daily.   24-Hr Dietary Recall (previous recall, did not assess recall on this follow up) First Meal: 11am: greek yogurt, chia seeds Snack: none Second Meal: tuna and avocado and sweet potato fries Snack: none Third Meal: quinoa with spinach and feta Snack: none Beverages: 78 oz water and himalayan salt sprinkled in, chlorophyll water with chia seads, black tea with lemon balm.    NUTRITION DIAGNOSIS  NB-1.1 Food and nutrition-related knowledge deficit As related to lack of prior nutrition education by a nutrition professional.  As evidenced by pt  report.   NUTRITION INTERVENTION  Nutrition education (E-1) on the following topics:  Fruits & Vegetables: Aim to fill half your plate with a variety of fruits and vegetables. They are rich in vitamins, minerals, and fiber, and can help reduce the risk of chronic diseases. Choose a colorful assortment of fruits and vegetables to ensure you get a wide range of nutrients. Grains and Starches: Make at least half of your grain choices whole grains, such as brown rice, whole wheat bread, and oats. Whole grains provide fiber, which aids in digestion and healthy cholesterol levels. Aim for whole forms of starchy vegetables such as potatoes, sweet potatoes, beans, peas, and corn, which are fiber rich and provide many vitamins and minerals.  Protein: Incorporate lean sources of protein, such as poultry, fish, beans, nuts, and seeds, into your meals. Protein is essential for building and repairing tissues, staying full, balancing blood sugar, as well as supporting immune function. Dairy: Include low-fat or fat-free dairy products like milk, yogurt, and cheese in your diet. Dairy foods are excellent sources of calcium and vitamin D, which are crucial for bone health.  Physical Activity: Aim for 60 minutes of physical activity daily. Regular physical activity promotes overall health-including helping to reduce risk for heart disease and diabetes, promoting mental health, and helping [place] sleep better.  Discussed stress management techniques, such as grounding techniques, physical activity, sleep hygiene, tips for getting better sleep, etc. Also discussed the impact of stress on health, sleep, body, and eating habits including stress eating and emotional eating.  Encouraged pt to continue counseling for stress.    Handouts Provided  Include   No handouts provided on this follow up.   Learning Style & Readiness for Change Teaching method utilized: Visual & Auditory  Demonstrated degree of understanding via: Teach  Back  Barriers to learning/adherence to lifestyle change: none  Goals Established by Pt  Continue with Previous Goals:  Get some exercise in 3-5 days per week. (Strength training, walking, cycle, etc.)  Aim for at least 150 minutes of physical activity weekly.  Goal: aim to eat at least 2 meals per day. At meals, aim to include 1/2 plate non-starchy vegetables, 1/4 plate protein, and 1/4 plate complex carbs.   Aim to eat within 1-2 hours of waking up and every 3-5 hours following.   When snacking, aim to include a complex carb and protein.   MONITORING & EVALUATION Dietary intake, weekly physical activity, and follow up in 2 months.  Next Steps  Patient is to call for questions.

## 2024-01-10 ENCOUNTER — Encounter: Payer: Self-pay | Attending: Internal Medicine | Admitting: Dietician

## 2024-01-10 ENCOUNTER — Encounter: Payer: Self-pay | Admitting: Dietician

## 2024-01-10 VITALS — Wt 181.6 lb

## 2024-01-10 DIAGNOSIS — Z6835 Body mass index (BMI) 35.0-35.9, adult: Secondary | ICD-10-CM | POA: Diagnosis present

## 2024-01-10 DIAGNOSIS — E66812 Obesity, class 2: Secondary | ICD-10-CM | POA: Insufficient documentation

## 2024-01-10 NOTE — Progress Notes (Unsigned)
Medical Nutrition Therapy  Appointment Start time:  [phone] Appointment End time:  0605  Primary concerns (Initial assessment): Pt states she is concerned about her weight.    Referral diagnosis: E66.9 Preferred learning style: no preference indicated Learning readiness: ready   NUTRITION ASSESSMENT   Anthropometrics  Ht: 60 in Wt 01/10/24: 181.6 lbs Wt 10/25/23: 179 lbs Wt 08/16/23: 185 lbs Wt 06/16/23: 184 lbs  Clinical Medical Hx: anxiety, asthma, seizures, nerve/muscle disease Medications: reviewed Labs: reviewed Notable Signs/Symptoms: occasional nausea, high stress Food Allergies: none  Lifestyle & Dietary Hx  Pt present today with her two daughters.   Pt states she found out she had a mold problem  Sleep has improved from 3-4 hours to 6   Want to have more smoothies to hold her over.  Home gym [employer]  Hasn't been eating chicekn, goes to [employer] store.   Went from 5 min a day to 10 min, to 15 min a day  Pt states she finally got a therapist and feels like this has been beneficial to her mental health.  Pt reports she has been doing massage therapy and did 6 sessions and feels like this helped with the pain she had been experiencing in her feet.     Estimated daily fluid intake: 86 oz Supplements: folic acid Sleep: 3-4 hours Stress / self-care: moderate stress Current average weekly physical activity: walking/home exercises 15 minutes daily.  24-Hr Dietary Recall (previous recall, did not assess recall on this follow up) First Meal: 11am: greek yogurt, chia seeds Snack: none  Second Meal:  Snack: almonds and smoothie Third Meal: beef stew with vegetables and purple potatoes Snack: none Beverages: 78 oz water and himalayan salt sprinkled in, chlorophyll water with chia seads, black tea with lemon balm.    NUTRITION DIAGNOSIS  NB-1.1 Food and nutrition-related knowledge deficit As related to lack of prior nutrition education by a nutrition  professional.  As evidenced by pt report.   NUTRITION INTERVENTION  Nutrition education (E-1) on the following topics:  Fruits & Vegetables: Aim to fill half your plate with a variety of fruits and vegetables. They are rich in vitamins, minerals, and fiber, and can help reduce the risk of chronic diseases. Choose a colorful assortment of fruits and vegetables to ensure you get a wide range of nutrients. Grains and Starches: Make at least half of your grain choices whole grains, such as brown rice, whole wheat bread, and oats. Whole grains provide fiber, which aids in digestion and healthy cholesterol levels. Aim for whole forms of starchy vegetables such as potatoes, sweet potatoes, beans, peas, and corn, which are fiber rich and provide many vitamins and minerals.  Protein: Incorporate lean sources of protein, such as poultry, fish, beans, nuts, and seeds, into your meals. Protein is essential for building and repairing tissues, staying full, balancing blood sugar, as well as supporting immune function. Dairy: Include low-fat or fat-free dairy products like milk, yogurt, and cheese in your diet. Dairy foods are excellent sources of calcium and vitamin D, which are crucial for bone health.  Physical Activity: Aim for 60 minutes of physical activity daily. Regular physical activity promotes overall health-including helping to reduce risk for heart disease and diabetes, promoting mental health, and helping [place] sleep better.  Discussed stress management techniques, such as grounding techniques, physical activity, sleep hygiene, tips for getting better sleep, etc. Also discussed the impact of stress on health, sleep, body, and eating habits including stress eating and emotional eating.  Encouraged pt to continue counseling for stress.    Handouts Provided Include   No handouts provided on this follow up.   Learning Style & Readiness for Change Teaching method utilized: Visual & Auditory  Demonstrated  degree of understanding via: Teach Back  Barriers to learning/adherence to lifestyle change: none  Goals Established by Pt  Continue with Previous Goals:  Get some exercise in 3-5 days per week. (Strength training, walking, cycle, etc.)  Aim for at least 150 minutes of physical activity weekly.  Goal: aim to eat at least 2 meals per day. At meals, aim to include 1/2 plate non-starchy vegetables, 1/4 plate protein, and 1/4 plate complex carbs.   Aim to eat within 1-2 hours of waking up and every 3-5 hours following.   When snacking, aim to include a complex carb and protein.   MONITORING & EVALUATION Dietary intake, weekly physical activity, and follow up in 4 months.  Next Steps  Patient is to call for questions.

## 2024-04-23 ENCOUNTER — Ambulatory Visit: Payer: No Typology Code available for payment source | Admitting: Dietician

## 2024-06-17 ENCOUNTER — Telehealth: Payer: Self-pay

## 2024-06-17 ENCOUNTER — Ambulatory Visit: Payer: No Typology Code available for payment source | Admitting: Neurology

## 2024-06-17 MED ORDER — TOPIRAMATE 100 MG PO TABS: 150.0000 mg | ORAL_TABLET | Freq: Two times a day (BID) | ORAL | 0 refills | Status: AC

## 2024-06-17 NOTE — Telephone Encounter (Signed)
 Refill sent as requested.

## 2024-06-17 NOTE — Telephone Encounter (Signed)
 Patient came in today for her appointment at 3:45. She checked to see if we had received a [place] auth to cover the appointment. We advised that we had not and that this appointment would not be covered. She stated that this was the third time this has happened at the office. I spoke with her in length about the processes we were putting in place to try and prevent this from happening in the future.   I was able to cancel the appointment and get her rescheduled for Thursday July 8 at 11:45. She is aware that this is a double booking approved by [facility]. I let her know that a STAT RFS request was sent to the [facility] for approval for her appointment.    She advised that has 8 days left of her medication  topiramate  (TOPAMAX ) 100 MG tablet. She is asking that a 30 day refill be sent to the [facility] - [place],  - [address]. She was concerned that she would not be able to get he medication since she did not have a new auth. I advised that I did look on [facility] and that if needed, we could send the RX to a different pharmacy and she could get it for as low as $5. She advised that she had a card that she could use if that was the case to get her refill. She expressed appreciation for the effort and the assistance with this issue.  Please send medication refill for patient.

## 2024-06-27 ENCOUNTER — Encounter: Payer: Self-pay | Admitting: Neurology

## 2024-06-27 ENCOUNTER — Ambulatory Visit: Admitting: Neurology

## 2024-06-27 DIAGNOSIS — G40209 Localization-related (focal) (partial) symptomatic epilepsy and epileptic syndromes with complex partial seizures, not intractable, without status epilepticus: Secondary | ICD-10-CM | POA: Diagnosis not present

## 2024-06-27 DIAGNOSIS — G43709 Chronic migraine without aura, not intractable, without status migrainosus: Secondary | ICD-10-CM | POA: Diagnosis not present

## 2024-06-27 DIAGNOSIS — G935 Compression of brain: Secondary | ICD-10-CM | POA: Diagnosis not present

## 2024-06-27 NOTE — Patient Instructions (Signed)
 Continue current medications Continue to follow-up with psychiatrist Follow-up in 6 to 8 months sooner if worse.
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
## 2024-10-30 ENCOUNTER — Telehealth: Payer: Self-pay

## 2024-10-30 NOTE — Telephone Encounter (Signed)
 [facility] RFS form completed and given to Dr Gregg for signature

## 2024-10-30 NOTE — Telephone Encounter (Signed)
 Signed RFS form emailed to [place]

## 2025-02-12 ENCOUNTER — Ambulatory Visit: Admitting: Neurology
